# Patient Record
Sex: Female | Born: 1985 | Race: White | Hispanic: No | Marital: Married | Smoking: Never smoker
Health system: Southern US, Community
[De-identification: ages and names within clinical notes are randomized; demographics above are authoritative.]

## PROBLEM LIST (undated history)

## (undated) DIAGNOSIS — K259 Gastric ulcer, unspecified as acute or chronic, without hemorrhage or perforation: Secondary | ICD-10-CM

## (undated) DIAGNOSIS — F5000 Anorexia nervosa, unspecified: Secondary | ICD-10-CM

## (undated) HISTORY — DX: Gastric ulcer, unspecified as acute or chronic, without hemorrhage or perforation: K25.9

## (undated) HISTORY — DX: Anorexia nervosa, unspecified: F50.00

## (undated) HISTORY — PX: HERNIA REPAIR: SHX51

---

## 2015-11-21 ENCOUNTER — Ambulatory Visit (INDEPENDENT_AMBULATORY_CARE_PROVIDER_SITE_OTHER): Payer: Self-pay | Admitting: *Deleted

## 2015-11-21 DIAGNOSIS — Z3200 Encounter for pregnancy test, result unknown: Secondary | ICD-10-CM

## 2015-11-22 LAB — POCT PREGNANCY, URINE: PREG TEST UR: POSITIVE — AB

## 2015-12-09 NOTE — L&D Delivery Note (Signed)
Delivery Note Admitted in active labor, no augmentation, progressed well on her own w/ srom mod MSF- desired waterbirth- got in tub not long after srom. 2 Doublas present. A short time later she felt strong urge to push, and at 4:13 AM a viable female was delivered via vtx (Presentation: unknown- under water and couldn't see presentation), birthed completely by pt w/ my direct supervision and gently brought to surface of water by her. Directly skin-to-skin on mom's chest, dried/stimulated w/ good respirations/cry.  APGAR:see delivery summary documentation. Weight: unknown at time of note.  Mom allowed to bond w/ baby, then out of tub for placenta.  Placenta status:delivered w/ pt standing just outside of tub without difficulty. Pt refused clamping/cutting cord until further notice.  Cord: 3VC, with the following complications: none.  Cord pH: not done  Anesthesia:  none Episiotomy:  n/a Lacerations:  Small Lt periurethral, appears hemostatic, but pt would not let me exam it or perineum-which appeared to be intact- any further, stating 'she does not want stitches, and that was enough inspection' Suture Repair: n/a Est. Blood Loss (mL):   Mom to postpartum.  Baby to Couplet care / Skin to Skin. Breastfeeding, plans mirena, no circumcision  Marge Duncans 06/29/2016, 6:40 AM

## 2015-12-19 ENCOUNTER — Encounter: Payer: Self-pay | Admitting: Advanced Practice Midwife

## 2015-12-19 ENCOUNTER — Ambulatory Visit (INDEPENDENT_AMBULATORY_CARE_PROVIDER_SITE_OTHER): Payer: Managed Care, Other (non HMO) | Admitting: Advanced Practice Midwife

## 2015-12-19 VITALS — BP 117/68 | HR 92 | Temp 98.6°F | Ht 64.0 in | Wt 118.9 lb

## 2015-12-19 DIAGNOSIS — Z3491 Encounter for supervision of normal pregnancy, unspecified, first trimester: Secondary | ICD-10-CM | POA: Diagnosis not present

## 2015-12-19 DIAGNOSIS — Z77011 Contact with and (suspected) exposure to lead: Secondary | ICD-10-CM

## 2015-12-19 DIAGNOSIS — O3680X0 Pregnancy with inconclusive fetal viability, not applicable or unspecified: Secondary | ICD-10-CM

## 2015-12-19 DIAGNOSIS — Z36 Encounter for antenatal screening of mother: Secondary | ICD-10-CM | POA: Diagnosis not present

## 2015-12-19 DIAGNOSIS — Z3490 Encounter for supervision of normal pregnancy, unspecified, unspecified trimester: Secondary | ICD-10-CM | POA: Insufficient documentation

## 2015-12-19 DIAGNOSIS — Z3481 Encounter for supervision of other normal pregnancy, first trimester: Secondary | ICD-10-CM

## 2015-12-19 DIAGNOSIS — O3680X1 Pregnancy with inconclusive fetal viability, fetus 1: Secondary | ICD-10-CM

## 2015-12-19 DIAGNOSIS — Z113 Encounter for screening for infections with a predominantly sexual mode of transmission: Secondary | ICD-10-CM | POA: Diagnosis not present

## 2015-12-19 LAB — POCT URINALYSIS DIP (DEVICE)
Bilirubin Urine: NEGATIVE
Glucose, UA: NEGATIVE mg/dL
Hgb urine dipstick: NEGATIVE
Ketones, ur: NEGATIVE mg/dL
Leukocytes, UA: NEGATIVE
NITRITE: NEGATIVE
PH: 7 (ref 5.0–8.0)
PROTEIN: NEGATIVE mg/dL
Specific Gravity, Urine: 1.02 (ref 1.005–1.030)
Urobilinogen, UA: 0.2 mg/dL (ref 0.0–1.0)

## 2015-12-19 LAB — OB RESULTS CONSOLE GC/CHLAMYDIA
Chlamydia: NEGATIVE
GC PROBE AMP, GENITAL: NEGATIVE

## 2015-12-19 LAB — OB RESULTS CONSOLE RUBELLA ANTIBODY, IGM: RUBELLA: IMMUNE

## 2015-12-19 NOTE — Progress Notes (Signed)
   Subjective:    Jacqueline Andersen is a G2P1001 2618w3d by LMP and US being seen today for her first obstetrical visit.  Her obstetrical history is significant for nothing. Patient does intend to breast feed. Pregnancy history fully reviewed.  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 Requesting testing.  Filed Vitals:   12/19/15 0921 12/19/15 0944  BP: 117/68   Pulse: 92   Temp: 98.6 F (37 C)   Height:  5\' 4"  (1.626 m)  Weight: 118 lb 14.4 oz (53.933 kg)     HISTORY: OB History  Gravida Para Term Preterm AB SAB TAB Ectopic Multiple Living  2 1 1  0 0 0 0 0 0 1    # Outcome Date GA Lbr Len/2nd Weight Sex Delivery Anes PTL Lv  2 Current           1 Term 12/04/11 2820w0d  7 lb (3.175 kg) M Vag-Spont   Y     Past Medical History  Diagnosis Date  . Anorexia nervosa 7212-30 years old  . Gastric ulcer 30 years old   Past Surgical History  Procedure Laterality Date  . Hernia repair      as a baby   Family History  Problem Relation Age of Onset  . Cancer Maternal Grandmother   . Cancer Paternal Grandmother      Exam    Uterus:  Fundal Height: 12 cm  Pelvic Exam: Deferred. Will perform w/ Pap   Bony Pelvis: proven to 7 lb  System: Breast:  normal appearance, no masses or tenderness   Skin: normal coloration and turgor, no rashes    Neurologic: oriented, normal, grossly non-focal   Extremities: normal strength, tone, and muscle mass   HEENT sclera clear, anicteric and thyroid without masses   Mouth/Teeth mucous membranes moist, pharynx normal without lesions and dental hygiene good   Neck supple and no masses   Cardiovascular: regular rate and rhythm, no murmurs or gallops   Respiratory:  appears well, vitals normal, no respiratory distress, acyanotic, normal RR, chest clear, no wheezing, crepitations, rhonchi, normal symmetric air entry   Abdomen: soft, non-tender; bowel sounds normal; no masses,  no organomegaly   Urinary:  deferred      Assessment:    Pregnancy: G2P1001 Patient Active Problem List   Diagnosis Date Noted  . Supervision of normal pregnancy, antepartum 12/19/2015  1. Supervision of low-risk pregnancy, first trimester  - Prenatal Profile - GC/Chlamydia probe amp ([place] - Prescript Monitor Profile(19) - Culture, OB Urine - Cystic fibrosis diagnostic study - Amniotic fluid index  2. Supervision of normal pregnancy, antepartum, first trimester   3. Lead exposure   4. Examination to determine fetal viability of pregnancy, fetus 1  - Amniotic fluid index  5. Hx anorexia  - Denies current problem - Encouraged counseling PRN      Plan:     Initial labs drawn. Prenatal vitamins. Problem list reviewed and updated. Genetic Screening discussed First Screen: declined. CF drawn  Ultrasound discussed; fetal survey: requested.  Follow up in 4 weeks. Pt wants to check insurance coverage for Pap first. Ask about at [place].   Dorathy KinsmanSMITH, Jamarr Treinen 12/19/2015

## 2015-12-19 NOTE — Progress Notes (Signed)
Breastfeeding tip of the week reviewed Initial prenatal education packet given Initial prenatal labs today Declines flu

## 2015-12-19 NOTE — Patient Instructions (Signed)
Second Trimester of Pregnancy The second trimester is from week 13 through week 28, months 4 through 6. The second trimester is often a time when you feel your best. Your body has also adjusted to being pregnant, and you begin to feel better physically. Usually, morning sickness has lessened or quit completely, you may have more energy, and you may have an increase in appetite. The second trimester is also a time when the fetus is growing rapidly. At the end of the sixth month, the fetus is about 9 inches long and weighs about 1 pounds. You will likely begin to feel the baby move (quickening) between 18 and 20 weeks of the pregnancy. BODY CHANGES Your body goes through many changes during pregnancy. The changes vary from woman to woman.   Your weight will continue to increase. You will notice your lower abdomen bulging out.  You may begin to get stretch marks on your hips, abdomen, and breasts.  You may develop headaches that can be relieved by medicines approved by your health care provider.  You may urinate more often because the fetus is pressing on your bladder.  You may develop or continue to have heartburn as a result of your pregnancy.  You may develop constipation because certain hormones are causing the muscles that push waste through your intestines to slow down.  You may develop hemorrhoids or swollen, bulging veins (varicose veins).  You may have back pain because of the weight gain and pregnancy hormones relaxing your joints between the bones in your pelvis and as a result of a shift in weight and the muscles that support your balance.  Your breasts will continue to grow and be tender.  Your gums may bleed and may be sensitive to brushing and flossing.  Dark spots or blotches (chloasma, mask of pregnancy) may develop on your face. This will likely fade after the baby is born.  A dark line from your belly button to the pubic area (linea nigra) may appear. This will likely  fade after the baby is born.  You may have changes in your hair. These can include thickening of your hair, rapid growth, and changes in texture. Some women also have hair loss during or after pregnancy, or hair that feels dry or thin. Your hair will most likely return to normal after your baby is born. WHAT TO EXPECT AT YOUR PRENATAL VISITS During a routine prenatal visit:  You will be weighed to make sure you and the fetus are growing normally.  Your blood pressure will be taken.  Your abdomen will be measured to track your baby's growth.  The fetal heartbeat will be listened to.  Any test results from the previous visit will be discussed. Your health care provider may ask you:  How you are feeling.  If you are feeling the baby move.  If you have had any abnormal symptoms, such as leaking fluid, bleeding, severe headaches, or abdominal cramping.  If you are using any tobacco products, including cigarettes, chewing tobacco, and electronic cigarettes.  If you have any questions. Other tests that may be performed during your second trimester include:  Blood tests that check for:  Low iron levels (anemia).  Gestational diabetes (between 24 and 28 weeks).  Rh antibodies.  Urine tests to check for infections, diabetes, or protein in the urine.  An ultrasound to confirm the proper growth and development of the baby.  An amniocentesis to check for possible genetic problems.  Fetal screens for spina bifida   and Down syndrome.  HIV (human immunodeficiency virus) testing. Routine prenatal testing includes screening for HIV, unless you choose not to have this test. HOME CARE INSTRUCTIONS   Avoid all smoking, herbs, alcohol, and unprescribed drugs. These chemicals affect the formation and growth of the baby.  Do not use any tobacco products, including cigarettes, chewing tobacco, and electronic cigarettes. If you need help quitting, ask your health care provider. You may receive  counseling support and other resources to help you quit.  Follow your health care provider's instructions regarding medicine use. There are medicines that are either safe or unsafe to take during pregnancy.  Exercise only as directed by your health care provider. Experiencing uterine cramps is a good sign to stop exercising.  Continue to eat regular, healthy meals.  Wear a good support bra for breast tenderness.  Do not use hot tubs, steam rooms, or saunas.  Wear your seat belt at all times when driving.  Avoid raw meat, uncooked cheese, cat litter boxes, and soil used by cats. These carry germs that can cause birth defects in the baby.  Take your prenatal vitamins.  Take 1500-2000 mg of calcium daily starting at the 20th week of pregnancy until you deliver your baby.  Try taking a stool softener (if your health care provider approves) if you develop constipation. Eat more high-fiber foods, such as fresh vegetables or fruit and whole grains. Drink plenty of fluids to keep your urine clear or pale yellow.  Take warm sitz baths to soothe any pain or discomfort caused by hemorrhoids. Use hemorrhoid cream if your health care provider approves.  If you develop varicose veins, wear support hose. Elevate your feet for 15 minutes, 3-4 times a day. Limit salt in your diet.  Avoid heavy lifting, wear low heel shoes, and practice good posture.  Rest with your legs elevated if you have leg cramps or low back pain.  Visit your dentist if you have not gone yet during your pregnancy. Use a soft toothbrush to brush your teeth and be gentle when you floss.  A sexual relationship may be continued unless your health care provider directs you otherwise.  Continue to go to all your prenatal visits as directed by your health care provider. SEEK MEDICAL CARE IF:   You have dizziness.  You have mild pelvic cramps, pelvic pressure, or nagging pain in the abdominal area.  You have persistent nausea,  vomiting, or diarrhea.  You have a bad smelling vaginal discharge.  You have pain with urination. SEEK IMMEDIATE MEDICAL CARE IF:   You have a fever.  You are leaking fluid from your vagina.  You have spotting or bleeding from your vagina.  You have severe abdominal cramping or pain.  You have rapid weight gain or loss.  You have shortness of breath with chest pain.  You notice sudden or extreme swelling of your face, hands, ankles, feet, or legs.  You have not felt your baby move in over an hour.  You have severe headaches that do not go away with medicine.  You have vision changes.   This information is not intended to replace advice given to you by your health care provider. Make sure you discuss any questions you have with your health care provider.   Document Released: 11/18/2001 Document Revised: 12/15/2014 Document Reviewed: 01/25/2013 Elsevier Interactive Patient Education 2016 Elsevier Inc.   Pregnancy and Influenza Influenza, also called the flu, is an infection of the respiratory tract. If you are pregnant, you   are more likely to catch the flu. You are also more likely to have a more serious case of the flu. This is because pregnancy lowers your body's ability to fight off infections (it weakens your immune system). It also puts additional stress on your heart and lungs, which makes you more likely to have complications. Having a bad case of the flu, especially with a high fever, can be dangerous for your developing baby. It can cause you to go into early labor. HOW DO PEOPLE GET THE FLU? The flu is caused by the influenza virus. This virus is common every year in the fall and winter. It spreads when virus particles get passed from person to person. You can get the virus if you are near a sick person who is coughing or sneezing. You can also get the virus if you touch something that has the virus on it and then touch your face. HOW CAN I PROTECT MYSELF AGAINST THE  FLU?  Get a flu shot. The best way to prevent the flu is to get a flu shot before flu season starts. The flu shot is not dangerous for your developing baby. It may even help protect your baby from the flu for up to 6 months after birth. The flu shot is one type of flu vaccine. Another type is a nasal spray vaccine. Do not get the nasal spray vaccine. It is not approved for pregnancy.  Do not come in close contact with sick people.  Do not share food, drinks, or utensils with other people.  Wash your hands often. Use hand sanitizer when soap and water are not available. WHAT SHOULD I DO IF I HAVE FLU SYMPTOMS? If you have any flu symptoms, call your health care provider right away. Flu symptoms include:  Fever or chills.  Muscle aches.  Headache.  Sore throat.  Nasal congestion.  Cough.  Feeling tired.  Loss of appetite.  Vomiting.  Diarrhea. You may be able to take an antiviral medicine to keep the flu from becoming severe and to shorten how long it lasts. WHAT SHOULD I DO AT HOME IF I AM DIAGNOSED WITH THE FLU?  Do not take any medicine, including cold or flu medicine, unless directed by your health care provider.  If you take antiviral medicine, make sure you finish it even if you start to feel better.  Drink enough fluid to keep your urine clear or pale yellow.  Get plenty of rest. WHEN WOULD I SEEK IMMEDIATE MEDICAL CARE IF I HAVE THE FLU?  You have trouble breathing.  You have chest pain.  You begin to have labor pains.  You have a high fever that does not go down after you take medicine.  You do not feel your baby move.  You have diarrhea or vomiting that will not go away.   This information is not intended to replace advice given to you by your health care provider. Make sure you discuss any questions you have with your health care provider.   Document Released: 09/26/2008 Document Revised: 11/29/2013 Document Reviewed: 10/21/2013 Elsevier Interactive  Patient Education 2016 Elsevier Inc.   

## 2015-12-20 LAB — GC/CHLAMYDIA PROBE AMP (~~LOC~~) NOT AT ARMC
Chlamydia: NEGATIVE
Neisseria Gonorrhea: NEGATIVE

## 2015-12-20 NOTE — Progress Notes (Signed)
Bedside US for viability and estimate of EGA = Single IUP, FHR - 156 bpm per PW doppler, FL - 0.812cm (12w 3d), CRL - 5.41cm (12w), FM present

## 2015-12-26 DIAGNOSIS — Z77011 Contact with and (suspected) exposure to lead: Secondary | ICD-10-CM | POA: Insufficient documentation

## 2016-01-16 ENCOUNTER — Ambulatory Visit (INDEPENDENT_AMBULATORY_CARE_PROVIDER_SITE_OTHER): Payer: Managed Care, Other (non HMO) | Admitting: Certified Nurse Midwife

## 2016-01-16 VITALS — BP 106/58 | HR 93 | Temp 98.6°F | Wt 124.7 lb

## 2016-01-16 DIAGNOSIS — Z3482 Encounter for supervision of other normal pregnancy, second trimester: Secondary | ICD-10-CM

## 2016-01-16 DIAGNOSIS — Z124 Encounter for screening for malignant neoplasm of cervix: Secondary | ICD-10-CM | POA: Diagnosis not present

## 2016-01-16 LAB — POCT URINALYSIS DIP (DEVICE)
Bilirubin Urine: NEGATIVE
Glucose, UA: NEGATIVE mg/dL
Hgb urine dipstick: NEGATIVE
Ketones, ur: NEGATIVE mg/dL
Nitrite: NEGATIVE
Protein, ur: NEGATIVE mg/dL
Specific Gravity, Urine: 1.02 (ref 1.005–1.030)
Urobilinogen, UA: 0.2 mg/dL (ref 0.0–1.0)
pH: 6.5 (ref 5.0–8.0)

## 2016-01-16 NOTE — Progress Notes (Signed)
Subjective:  Jacqueline Andersen is a 30 y.o. G2P1001 at [redacted]w[redacted]d being seen today for ongoing prenatal care.  She is currently monitored for the following issues for this low-risk pregnancy and has Supervision of normal pregnancy, antepartum and Lead exposure on her problem list.  Patient reports backache, fatigue and round ligament pain.  Contractions: Not present. Vag. Bleeding: None.  Movement: Present. Denies leaking of fluid.   The following portions of the patient's history were reviewed and updated as appropriate: allergies, current medications, past family history, past medical history, past social history, past surgical history and problem list. Problem list updated.  Objective:   Filed Vitals:   01/16/16 1038  BP: 106/58  Pulse: 93  Temp: 98.6 F (37 C)  Weight: 124 lb 11.2 oz (56.564 kg)    Fetal Status: Fetal Heart Rate (bpm): 147 Fundal Height: 13 cm Movement: Present     General:  Alert, oriented and cooperative. Patient is in no acute distress.  Skin: Skin is warm and dry. No rash noted.   Cardiovascular: Normal heart rate noted  Respiratory: Normal respiratory effort, no problems with respiration noted  Abdomen: Soft, gravid, appropriate for gestational age. Pain/Pressure: Present     Pelvic: Vag. Bleeding: None     Cervical exam deferred       Pap done no bleeding after PAP  Extremities: Normal range of motion.  Edema: None  Mental Status: Normal mood and affect. Normal behavior. Normal judgment and thought content.   Urinalysis: Urine Protein: Negative Urine Glucose: Negative  Assessment and Plan:  Pregnancy: G2P1001 at [redacted]w[redacted]d  1. Supervision of normal pregnancy, antepartum, second trimester - Cytology - PAP Anatomy u/s scheduled Quad declined  Preterm labor symptoms and general obstetric precautions including but not limited to vaginal bleeding, contractions, leaking of fluid and fetal movement were reviewed in detail with the patient. Please refer to After Visit  Summary for other counseling recommendations.  No Follow-up on file. 4 weeks  Rhea Pink, CNM

## 2016-01-16 NOTE — Patient Instructions (Signed)
Vaginal Bleeding During Pregnancy, Second Trimester A small amount of bleeding (spotting) from the vagina is relatively common in pregnancy. It usually stops on its own. Various things can cause bleeding or spotting in pregnancy. Some bleeding may be related to the pregnancy, and some may not. Sometimes the bleeding is normal and is not a problem. However, bleeding can also be a sign of something serious. Be sure to tell your health care provider about any vaginal bleeding right away. Some possible causes of vaginal bleeding during the second trimester include:  Infection, inflammation, or growths on the cervix.   The placenta may be partially or completely covering the opening of the cervix inside the uterus (placenta previa).  The placenta may have separated from the uterus (abruption of the placenta).   You may be having early (preterm) labor.   The cervix may not be strong enough to keep a baby inside the uterus (cervical insufficiency).   Tiny cysts may have developed in the uterus instead of pregnancy tissue (molar pregnancy). HOME CARE INSTRUCTIONS  Watch your condition for any changes. The following actions may help to lessen any discomfort you are feeling:  Follow your health care provider's instructions for limiting your activity. If your health care provider orders bed rest, you may need to stay in bed and only get up to use the bathroom. However, your health care provider may allow you to continue light activity.  If needed, make plans for someone to help with your regular activities and responsibilities while you are on bed rest.  Keep track of the number of pads you use each day, how often you change pads, and how soaked (saturated) they are. Write this down.  Do not use tampons. Do not douche.  Do not have sexual intercourse or orgasms until approved by your health care provider.  If you pass any tissue from your vagina, save the tissue so you can show it to your  health care provider.  Only take over-the-counter or prescription medicines as directed by your health care provider.  Do not take aspirin because it can make you bleed.  Do not exercise or perform any strenuous activities or heavy lifting without your health care provider's permission.  Keep all follow-up appointments as directed by your health care provider. SEEK MEDICAL CARE IF:  You have any vaginal bleeding during any part of your pregnancy.  You have cramps or labor pains.  You have a fever, not controlled by medicine. SEEK IMMEDIATE MEDICAL CARE IF:   You have severe cramps in your back or belly (abdomen).  You have contractions.  You have chills.  You pass large clots or tissue from your vagina.  Your bleeding increases.  You feel light-headed or weak, or you have fainting episodes.  You are leaking fluid or have a gush of fluid from your vagina. MAKE SURE YOU:  Understand these instructions.  Will watch your condition.  Will get help right away if you are not doing well or get worse.   This information is not intended to replace advice given to you by your health care provider. Make sure you discuss any questions you have with your health care provider.   Document Released: 09/03/2005 Document Revised: 11/29/2013 Document Reviewed: 08/01/2013 Elsevier Interactive Patient Education 2016 Elsevier Inc.  

## 2016-01-16 NOTE — Progress Notes (Signed)
Breastfeeding tip of the week reviewed. 

## 2016-01-17 LAB — CYTOLOGY - PAP

## 2016-02-04 ENCOUNTER — Other Ambulatory Visit: Payer: Self-pay | Admitting: Certified Nurse Midwife

## 2016-02-04 ENCOUNTER — Ambulatory Visit (HOSPITAL_COMMUNITY)
Admission: RE | Admit: 2016-02-04 | Discharge: 2016-02-04 | Disposition: A | Discharge status: Home / Self Care | Payer: Managed Care, Other (non HMO) | Source: Ambulatory Visit | Attending: Certified Nurse Midwife | Admitting: Certified Nurse Midwife

## 2016-02-04 DIAGNOSIS — Z3482 Encounter for supervision of other normal pregnancy, second trimester: Secondary | ICD-10-CM

## 2016-02-04 DIAGNOSIS — Z3A19 19 weeks gestation of pregnancy: Secondary | ICD-10-CM | POA: Diagnosis not present

## 2016-02-04 DIAGNOSIS — Z36 Encounter for antenatal screening of mother: Secondary | ICD-10-CM | POA: Diagnosis not present

## 2016-02-04 DIAGNOSIS — Z3689 Encounter for other specified antenatal screening: Secondary | ICD-10-CM

## 2016-02-13 ENCOUNTER — Ambulatory Visit (INDEPENDENT_AMBULATORY_CARE_PROVIDER_SITE_OTHER): Payer: Managed Care, Other (non HMO) | Admitting: Certified Nurse Midwife

## 2016-02-13 VITALS — BP 107/62 | HR 90 | Temp 98.3°F | Wt 126.6 lb

## 2016-02-13 DIAGNOSIS — Z3482 Encounter for supervision of other normal pregnancy, second trimester: Secondary | ICD-10-CM

## 2016-02-13 LAB — POCT URINALYSIS DIP (DEVICE)
Bilirubin Urine: NEGATIVE
Glucose, UA: NEGATIVE mg/dL
Hgb urine dipstick: NEGATIVE
Ketones, ur: NEGATIVE mg/dL
Nitrite: NEGATIVE
Protein, ur: NEGATIVE mg/dL
Specific Gravity, Urine: 1.02 (ref 1.005–1.030)
Urobilinogen, UA: 1 mg/dL (ref 0.0–1.0)
pH: 7 (ref 5.0–8.0)

## 2016-02-13 NOTE — Progress Notes (Signed)
Pt reports pain in lower abdomen, pt reports it is constant and cant get comfortable at night. Cant sit or stand to long.

## 2016-02-13 NOTE — Patient Instructions (Signed)

## 2016-02-13 NOTE — Progress Notes (Signed)
Subjective:  Carilyn GoodpastureDane Kaucher is a 30 y.o. G2P1001 at 4835w3d being seen today for ongoing prenatal care.  She is currently monitored for the following issues for this low-risk pregnancy and has Supervision of normal pregnancy, antepartum and Lead exposure on her problem list.  Patient reports round ligament pain.   .  .  Movement: Present. Denies leaking of fluid.   The following portions of the patient's history were reviewed and updated as appropriate: allergies, current medications, past family history, past medical history, past social history, past surgical history and problem list. Problem list updated.  Objective:   Filed Vitals:   02/13/16 1113  BP: 107/62  Pulse: 90  Temp: 98.3 F (36.8 C)  Weight: 126 lb 9.6 oz (57.425 kg)    Fetal Status: Fetal Heart Rate (bpm): 145   Movement: Present     General:  Alert, oriented and cooperative. Patient is in no acute distress.  Skin: Skin is warm and dry. No rash noted.   Cardiovascular: Normal heart rate noted  Respiratory: Normal respiratory effort, no problems with respiration noted  Abdomen: Soft, gravid, appropriate for gestational age. Pain/Pressure: Present     Pelvic:       Cervical exam deferred        Extremities: Normal range of motion.  Edema: None  Mental Status: Normal mood and affect. Normal behavior. Normal judgment and thought content.   Urinalysis:      Assessment and Plan:  Pregnancy: G2P1001 at 10835w3d  1. Supervision of normal pregnancy, antepartum, second trimester  - AFP, Quad Screen  Preterm labor symptoms and general obstetric precautions including but not limited to vaginal bleeding, contractions, leaking of fluid and fetal movement were reviewed in detail with the patient. Please refer to After Visit Summary for other counseling recommendations.  Return in about 4 weeks (around 03/12/2016).   Rhea PinkLori A Juletta Berhe, CNM

## 2016-02-15 ENCOUNTER — Telehealth: Payer: Self-pay | Admitting: *Deleted

## 2016-02-15 NOTE — Telephone Encounter (Signed)
Patient called stating that she is pregnant and concerned that she experiences vaginal bleeding after sexual intercourse. Wants to know if this is normal.

## 2016-02-15 NOTE — Telephone Encounter (Signed)
Attempted to call, no answer, got message that voice mail was not set up yet.

## 2016-02-19 LAB — AFP, QUAD SCREEN
AFP: 95.7 ng/mL
Age Alone: 1:700 {titer}
Curr Gest Age: 20.3 wks.days
Down Syndrome Scr Risk Est: <1:38500 {titer}
HCG, Total: 21.71 IU/mL
INH: 177.8 pg/mL
Interpretation-AFP: NEGATIVE
MoM for AFP: 1.43
MoM for INH: 0.79
MoM for hCG: 0.92
Open Spina bifida: NEGATIVE
Osb Risk: 1:3410 {titer}
Tri 18 Scr Risk Est: NEGATIVE
Trisomy 18 (Edward) Syndrome Interp.: 1:55900 {titer}
uE3 Mom: 0.96
uE3 Value: 1.88 ng/mL

## 2016-02-20 NOTE — Telephone Encounter (Signed)
Attempted to contact pt and was unable to LM due to VM not set up.

## 2016-02-22 ENCOUNTER — Telehealth: Payer: Self-pay | Admitting: *Deleted

## 2016-02-22 NOTE — Telephone Encounter (Signed)
Spoke with Dr. Debroah LoopArnold about patient and he recommends that she take benadryl and use hydrocortisone. Called patient back and small child answered the phone, asked for patient and call was disconnected. Tried again and child answered again. Will try again later.

## 2016-02-22 NOTE — Telephone Encounter (Signed)
Pt states that she got poison ivy a few days ago and it has spread all over her whole body. She is using home remedies like ACV and Calamine. She is feeling miserable and running a fever now. Is there anything safe that she can use or take to help with it?

## 2016-02-22 NOTE — Telephone Encounter (Signed)
Informed patient of Dr. Brayton LaymanArnolds plan. Patient had no further questions.

## 2016-03-12 ENCOUNTER — Encounter: Payer: Managed Care, Other (non HMO) | Admitting: Advanced Practice Midwife

## 2016-03-20 ENCOUNTER — Ambulatory Visit (INDEPENDENT_AMBULATORY_CARE_PROVIDER_SITE_OTHER): Payer: Managed Care, Other (non HMO) | Admitting: Family

## 2016-03-20 VITALS — BP 113/70 | HR 89 | Temp 98.2°F | Wt 131.2 lb

## 2016-03-20 DIAGNOSIS — Z77011 Contact with and (suspected) exposure to lead: Secondary | ICD-10-CM

## 2016-03-20 DIAGNOSIS — Z3482 Encounter for supervision of other normal pregnancy, second trimester: Secondary | ICD-10-CM

## 2016-03-20 LAB — POCT URINALYSIS DIP (DEVICE)
Bilirubin Urine: NEGATIVE
Glucose, UA: NEGATIVE mg/dL
Hgb urine dipstick: NEGATIVE
KETONES UR: NEGATIVE mg/dL
Nitrite: NEGATIVE
PH: 7 (ref 5.0–8.0)
PROTEIN: NEGATIVE mg/dL
SPECIFIC GRAVITY, URINE: 1.015 (ref 1.005–1.030)
Urobilinogen, UA: 0.2 mg/dL (ref 0.0–1.0)

## 2016-03-20 NOTE — Progress Notes (Signed)
Subjective:  Jacqueline GoodpastureDane Dzik is a 30 y.o. G2P1001 at 7661w4d being seen today for ongoing prenatal care.  She is currently monitored for the following issues for this low-risk pregnancy and has Supervision of normal pregnancy, antepartum and Lead exposure on her problem list.  Patient reports no complaints.  Contractions: Not present. Vag. Bleeding: None.  Movement: Present. Denies leaking of fluid.   The following portions of the patient's history were reviewed and updated as appropriate: allergies, current medications, past family history, past medical history, past social history, past surgical history and problem list. Problem list updated.  Objective:   Filed Vitals:   03/20/16 1019  BP: 113/70  Pulse: 89  Temp: 98.2 F (36.8 C)  Weight: 131 lb 3.2 oz (59.512 kg)    Fetal Status: Fetal Heart Rate (bpm): 140 Fundal Height: 26 cm Movement: Present     General:  Alert, oriented and cooperative. Patient is in no acute distress.  Skin: Skin is warm and dry. No rash noted.   Cardiovascular: Normal heart rate noted  Respiratory: Normal respiratory effort, no problems with respiration noted  Abdomen: Soft, gravid, appropriate for gestational age. Pain/Pressure: Present     Pelvic: Vag. Bleeding: None     Cervical exam deferred        Extremities: Normal range of motion.  Edema: None  Mental Status: Normal mood and affect. Normal behavior. Normal judgment and thought content.   Urinalysis: Urine Protein: Negative Urine Glucose: Negative  Assessment and Plan:  Pregnancy: G2P1001 at 4561w4d  1. Supervision of normal pregnancy, antepartum, second trimester - discussed third trimester labs  2. Lead exposure - Obtain lead level with third trimester labs  Preterm labor symptoms and general obstetric precautions including but not limited to vaginal bleeding, contractions, leaking of fluid and fetal movement were reviewed in detail with the patient. Please refer to After Visit Summary for other  counseling recommendations.  Return in about 3 weeks (around 04/10/2016).   Eino FarberWalidah Kennith GainN Karim, CNM

## 2016-03-20 NOTE — Progress Notes (Signed)
Educated pt on Benefits of Breastfeeding for Baby 

## 2016-04-09 ENCOUNTER — Ambulatory Visit (INDEPENDENT_AMBULATORY_CARE_PROVIDER_SITE_OTHER): Payer: Managed Care, Other (non HMO) | Admitting: Advanced Practice Midwife

## 2016-04-09 VITALS — BP 106/58 | HR 93 | Wt 133.4 lb

## 2016-04-09 DIAGNOSIS — Z23 Encounter for immunization: Secondary | ICD-10-CM | POA: Diagnosis not present

## 2016-04-09 DIAGNOSIS — Z77011 Contact with and (suspected) exposure to lead: Secondary | ICD-10-CM

## 2016-04-09 DIAGNOSIS — O99613 Diseases of the digestive system complicating pregnancy, third trimester: Secondary | ICD-10-CM

## 2016-04-09 DIAGNOSIS — K219 Gastro-esophageal reflux disease without esophagitis: Secondary | ICD-10-CM

## 2016-04-09 DIAGNOSIS — Z3483 Encounter for supervision of other normal pregnancy, third trimester: Secondary | ICD-10-CM

## 2016-04-09 LAB — CBC
HEMATOCRIT: 31.8 % — AB (ref 35.0–45.0)
HEMOGLOBIN: 10.6 g/dL — AB (ref 11.7–15.5)
MCH: 28.9 pg (ref 27.0–33.0)
MCHC: 33.3 g/dL (ref 32.0–36.0)
MCV: 86.6 fL (ref 80.0–100.0)
MPV: 9.9 fL (ref 7.5–12.5)
Platelets: 189 10*3/uL (ref 140–400)
RBC: 3.67 MIL/uL — AB (ref 3.80–5.10)
RDW: 14.1 % (ref 11.0–15.0)
WBC: 4.9 10*3/uL (ref 3.8–10.8)

## 2016-04-09 LAB — POCT URINALYSIS DIP (DEVICE)
BILIRUBIN URINE: NEGATIVE
GLUCOSE, UA: NEGATIVE mg/dL
Hgb urine dipstick: NEGATIVE
KETONES UR: NEGATIVE mg/dL
LEUKOCYTES UA: NEGATIVE
Nitrite: NEGATIVE
Protein, ur: NEGATIVE mg/dL
Urobilinogen, UA: 0.2 mg/dL (ref 0.0–1.0)
pH: 5.5 (ref 5.0–8.0)

## 2016-04-09 MED ORDER — FAMOTIDINE-CA CARB-MAG HYDROX 10-800-165 MG PO CHEW: 2.0000 | CHEWABLE_TABLET | Freq: Two times a day (BID) | ORAL | Status: DC | PRN

## 2016-04-09 MED ORDER — TETANUS-DIPHTH-ACELL PERTUSSIS 5-2.5-18.5 LF-MCG/0.5 IM SUSP
0.5000 mL | Freq: Once | INTRAMUSCULAR | Status: AC
  Administered 2016-04-09: 0.5 mL via INTRAMUSCULAR

## 2016-04-09 NOTE — Progress Notes (Signed)
Increased restless legs at night time

## 2016-04-09 NOTE — Patient Instructions (Addendum)
Food Choices for Gastroesophageal Reflux Disease, Adult When you have gastroesophageal reflux disease (GERD), the foods you eat and your eating habits are very important. Choosing the right foods can help ease the discomfort of GERD. WHAT GENERAL GUIDELINES DO I NEED TO FOLLOW?  Choose fruits, vegetables, whole grains, low-fat dairy products, and low-fat meat, fish, and poultry.  Limit fats such as oils, salad dressings, butter, nuts, and avocado.  Keep a food diary to identify foods that cause symptoms.  Avoid foods that cause reflux. These may be different for different people.  Eat frequent small meals instead of three large meals each day.  Eat your meals slowly, in a relaxed setting.  Limit fried foods.  Cook foods using methods other than frying.  Avoid drinking alcohol.  Avoid drinking large amounts of liquids with your meals.  Avoid bending over or lying down until 2-3 hours after eating. WHAT FOODS ARE NOT RECOMMENDED? The following are some foods and drinks that may worsen your symptoms: Vegetables Tomatoes. Tomato juice. Tomato and spaghetti sauce. Chili peppers. Onion and garlic. Horseradish. Fruits Oranges, grapefruit, and lemon (fruit and juice). Meats High-fat meats, fish, and poultry. This includes hot dogs, ribs, ham, sausage, salami, and bacon. Dairy Whole milk and chocolate milk. Sour cream. Cream. Butter. Ice cream. Cream cheese.  Beverages Coffee and tea, with or without caffeine. Carbonated beverages or energy drinks. Condiments Hot sauce. Barbecue sauce.  Sweets/Desserts Chocolate and cocoa. Donuts. Peppermint and spearmint. Fats and Oils High-fat foods, including French fries and potato chips. Other Vinegar. Strong spices, such as black pepper, white pepper, red pepper, cayenne, curry powder, cloves, ginger, and chili powder. The items listed above may not be a complete list of foods and beverages to avoid. Contact your dietitian for more  information.   This information is not intended to replace advice given to you by your health care provider. Make sure you discuss any questions you have with your health care provider.   Document Released: 11/24/2005 Document Revised: 12/15/2014 Document Reviewed: 09/28/2013 Elsevier Interactive Patient Education 2016 Elsevier Inc.  

## 2016-04-09 NOTE — Addendum Note (Signed)
Addended by: Faythe CasaBELLAMY, Anzlee Hinesley M on: 04/09/2016 12:16 PM   Modules accepted: Orders

## 2016-04-09 NOTE — Progress Notes (Signed)
Subjective:  Jacqueline Andersen is a 30 y.o. G2P1001 at 769w3d being seen today for ongoing prenatal care.  She is currently monitored for the following issues for this low-risk pregnancy and has Supervision of normal pregnancy, antepartum and Lead exposure on her problem list.  Patient reports heartburn and restless legs .  Contractions: Not present. Vag. Bleeding: None.  Movement: Present. Denies leaking of fluid.   The following portions of the patient's history were reviewed and updated as appropriate: allergies, current medications, past family history, past medical history, past social history, past surgical history and problem list. Problem list updated.  Objective:   Filed Vitals:   04/09/16 0935  BP: 106/58  Pulse: 93  Weight: 133 lb 6.4 oz (60.51 kg)    Fetal Status: Fetal Heart Rate (bpm): 137   Movement: Present     General:  Alert, oriented and cooperative. Patient is in no acute distress.  Skin: Skin is warm and dry. No rash noted.   Cardiovascular: Normal heart rate noted  Respiratory: Normal respiratory effort, no problems with respiration noted  Abdomen: Soft, gravid, appropriate for gestational age. Pain/Pressure: Present     Pelvic: Vag. Bleeding: None Vag D/C Character: White   Cervical exam deferred        Extremities: Normal range of motion.  Edema: Trace  Mental Status: Normal mood and affect. Normal behavior. Normal judgment and thought content.   Urinalysis:    N/N  Assessment and Plan:  Pregnancy: G2P1001 at 469w3d  1. Lead exposure  - Lead, blood (adult age 30 yrs or greater)  2. Supervision of normal pregnancy, antepartum, third trimester  - Glucose Tolerance, 1 HR (50g) w/o Fasting - CBC - RPR - HIV antibody (with reflex)  3. Reflux  - Pepcid  Preterm labor symptoms and general obstetric precautions including but not limited to vaginal bleeding, contractions, leaking of fluid and fetal movement were reviewed in detail with the patient. Please refer  to After Visit Summary for other counseling recommendations.  Return in about 2 weeks (around 04/23/2016).   Jacqueline Andersen, CNM

## 2016-04-10 LAB — GLUCOSE TOLERANCE, 1 HOUR (50G) W/O FASTING: Glucose, 1 Hr, gestational: 107 mg/dL (ref <140)

## 2016-04-10 LAB — RPR

## 2016-04-10 LAB — HIV ANTIBODY (ROUTINE TESTING W REFLEX): HIV: NONREACTIVE

## 2016-04-11 LAB — LEAD, BLOOD (ADULT >= 16 YRS): LEAD-WHOLE BLOOD: 1 ug/dL (ref <5)

## 2016-04-15 ENCOUNTER — Telehealth: Payer: Self-pay

## 2016-04-15 NOTE — Telephone Encounter (Signed)
Pt has been informed of normal lead levels test results.

## 2016-04-30 ENCOUNTER — Ambulatory Visit (INDEPENDENT_AMBULATORY_CARE_PROVIDER_SITE_OTHER): Payer: Medicaid Other | Admitting: Family

## 2016-04-30 VITALS — BP 99/60 | HR 83 | Wt 138.9 lb

## 2016-04-30 DIAGNOSIS — Z3483 Encounter for supervision of other normal pregnancy, third trimester: Secondary | ICD-10-CM

## 2016-04-30 DIAGNOSIS — Z77011 Contact with and (suspected) exposure to lead: Secondary | ICD-10-CM | POA: Diagnosis not present

## 2016-04-30 LAB — POCT URINALYSIS DIP (DEVICE)
Bilirubin Urine: NEGATIVE
Glucose, UA: NEGATIVE mg/dL
HGB URINE DIPSTICK: NEGATIVE
Ketones, ur: NEGATIVE mg/dL
NITRITE: NEGATIVE
PH: 7 (ref 5.0–8.0)
Protein, ur: NEGATIVE mg/dL
Specific Gravity, Urine: 1.01 (ref 1.005–1.030)
UROBILINOGEN UA: 0.2 mg/dL (ref 0.0–1.0)

## 2016-04-30 NOTE — Patient Instructions (Signed)
Thinking About Doren Custard???  You must attend a Doren Custard class at [facility]  3rd Wednesday of every month from 7-9pm  Free  [employer] by calling [phone] or online at [email or web address].[email or web address]  Bring [place] the certificate from the class  Waterbirth supplies needed for [facility] Department patients:  Our practice has a Heritage manager in a Box tub at the hospital that you can borrow  You will need to purchase an accessory kit that has all needed supplies through [facility] [phone]) or online $175.00  Or you can purchase the supplies separately: o Single-use disposable tub liner for Birth Pool in a Box (REGULAR size) o New garden hose labeled "lead-free", "suitable for drinking water", o Electric drain pump to remove water (We recommend 792 gallon per hour or greater pump.)  o  "non-toxic" OR "water potable" o Garden hose to remove the dirty water o Fish net o Bathing suit top (optional) o Long-handled mirror (optional)  [employer] sells tubs for ~ $120 if you would rather purchase your own tub.  They also sell accessories, liners.    Www.waterbirthsolutions.com for tub purchases and supplies  The [employer] Ladies (www.thelaborladies.com) $275 for tub rental/set-up & take down/kit   [employer] information regarding doulas (labor support) who provide pool rentals:  IdentityList.se.htm   [employer] Labor Ladies (www.thelaborladies.com)  IdentityList.se.htm   Things that would prevent you from having a waterbirth:  Premature, <37wks  Previous cesarean birth  Presence of thick meconium-stained fluid  Multiple gestation (Twins, triplets, etc.)  Uncontrolled diabetes or gestational diabetes requiring medication  Hypertension  Heavy vaginal bleeding  Non-reassuring fetal heart rate  Active infection (MRSA, etc.)  If your labor has to be induced and induction  method requires continuous monitoring of the baby's heart rate  Other risks/issues identified by your obstetrical provider

## 2016-04-30 NOTE — Progress Notes (Signed)
Subjective:  Jacqueline Andersen is a 30 y.o. G2P1001 at 889w3d being seen today for ongoing prenatal care.  She is currently monitored for the following issues for this low-risk pregnancy and has Supervision of normal pregnancy, antepartum and Lead exposure on her problem list.  Patient reports no complaints.  Contractions: Not present. Vag. Bleeding: None.  Movement: Present. Denies leaking of fluid.   The following portions of the patient's history were reviewed and updated as appropriate: allergies, current medications, past family history, past medical history, past social history, past surgical history and problem list. Problem list updated.  Objective:   Filed Vitals:   04/30/16 1049  BP: 99/60  Pulse: 83  Weight: 138 lb 14.4 oz (63.005 kg)    Fetal Status: Fetal Heart Rate (bpm): 130 Fundal Height: 32 cm Movement: Present     General:  Alert, oriented and cooperative. Patient is in no acute distress.  Skin: Skin is warm and dry. No rash noted.   Cardiovascular: Normal heart rate noted  Respiratory: Normal respiratory effort, no problems with respiration noted  Abdomen: Soft, gravid, appropriate for gestational age. Pain/Pressure: Present     Pelvic: Vag. Bleeding: None     Cervical exam deferred        Extremities: Normal range of motion.  Edema: None  Mental Status: Normal mood and affect. Normal behavior. Normal judgment and thought content.   Urinalysis: Urine Protein: Negative Urine Glucose: Negative  Assessment and Plan:  Pregnancy: G2P1001 at 4089w3d  1. Supervision of normal pregnancy, antepartum, third trimester - Considering waterbirth, discussed class requirement and consent process; plans to register for June class   2. Lead exposure - Reviewed lead results  Preterm labor symptoms and general obstetric precautions including but not limited to vaginal bleeding, contractions, leaking of fluid and fetal movement were reviewed in detail with the patient. Please refer to  After Visit Summary for other counseling recommendations.  Return in about 2 weeks (around 05/14/2016).   Eino FarberWalidah Kennith GainN Andersen, CNM

## 2016-04-30 NOTE — Progress Notes (Signed)
Breastfeeding tip of the week reviewed. 

## 2016-05-27 ENCOUNTER — Other Ambulatory Visit (HOSPITAL_COMMUNITY)
Admission: RE | Admit: 2016-05-27 | Discharge: 2016-05-27 | Disposition: A | Discharge status: Home / Self Care | Payer: Managed Care, Other (non HMO) | Source: Ambulatory Visit | Attending: Advanced Practice Midwife | Admitting: Advanced Practice Midwife

## 2016-05-27 ENCOUNTER — Ambulatory Visit (INDEPENDENT_AMBULATORY_CARE_PROVIDER_SITE_OTHER): Payer: Medicaid Other | Admitting: Advanced Practice Midwife

## 2016-05-27 VITALS — BP 102/68 | HR 80 | Temp 98.5°F | Wt 137.1 lb

## 2016-05-27 DIAGNOSIS — Z113 Encounter for screening for infections with a predominantly sexual mode of transmission: Secondary | ICD-10-CM | POA: Insufficient documentation

## 2016-05-27 DIAGNOSIS — Z3483 Encounter for supervision of other normal pregnancy, third trimester: Secondary | ICD-10-CM

## 2016-05-27 LAB — POCT URINALYSIS DIP (DEVICE)
Bilirubin Urine: NEGATIVE
GLUCOSE, UA: NEGATIVE mg/dL
Hgb urine dipstick: NEGATIVE
Ketones, ur: NEGATIVE mg/dL
LEUKOCYTES UA: NEGATIVE
Nitrite: NEGATIVE
Protein, ur: NEGATIVE mg/dL
UROBILINOGEN UA: 0.2 mg/dL (ref 0.0–1.0)
pH: 6 (ref 5.0–8.0)

## 2016-05-27 LAB — OB RESULTS CONSOLE GBS: GBS: POSITIVE

## 2016-05-27 NOTE — Progress Notes (Signed)
C/o contractions everyday- intermittently

## 2016-05-27 NOTE — Progress Notes (Signed)
Subjective:  Jacqueline Andersen is a 30 y.o. G2P1001 at 4168w2d being seen today for ongoing prenatal care.  She is currently monitored for the following issues for this low-risk pregnancy and has Supervision of normal pregnancy, antepartum and Lead exposure on her problem list.  Patient reports no complaints.  Contractions: Irregular. Vag. Bleeding: None.  Movement: Present. Denies leaking of fluid.   The following portions of the patient's history were reviewed and updated as appropriate: allergies, current medications, past family history, past medical history, past social history, past surgical history and problem list. Problem list updated.  Objective:   Filed Vitals:   05/27/16 1104  BP: 102/68  Pulse: 80  Temp: 98.5 F (36.9 C)  Weight: 137 lb 1.6 oz (62.188 kg)    Fetal Status: Fetal Heart Rate (bpm): 153 Fundal Height: 36 cm Movement: Present     General:  Alert, oriented and cooperative. Patient is in no acute distress.  Skin: Skin is warm and dry. No rash noted.   Cardiovascular: Normal heart rate noted  Respiratory: Normal respiratory effort, no problems with respiration noted  Abdomen: Soft, gravid, appropriate for gestational age. Pain/Pressure: Present     Pelvic: Cervical exam deferred        Extremities: Normal range of motion.  Edema: Trace  Mental Status: Normal mood and affect. Normal behavior. Normal judgment and thought content.   Urinalysis: Urine Protein: Negative Urine Glucose: Negative  Assessment and Plan:  Pregnancy: G2P1001 at 10068w2d  1. Supervision of normal pregnancy, antepartum, third trimester  - Culture, beta strep (group b only) - GC/Chlamydia probe amp ([place] --[place] signed and witnessed, class scheduled tomorro --Plans to use doula service for tub  Preterm labor symptoms and general obstetric precautions including but not limited to vaginal bleeding, contractions, leaking of fluid and fetal movement were reviewed in  detail with the patient. Please refer to After Visit Summary for other counseling recommendations.  Return in about 3 weeks (around 06/17/2016).   Hurshel PartyLisa A Leftwich-Kirby, CNM

## 2016-05-28 ENCOUNTER — Encounter: Payer: Self-pay | Admitting: General Practice

## 2016-05-28 LAB — GC/CHLAMYDIA PROBE AMP (~~LOC~~) NOT AT ARMC
Chlamydia: NEGATIVE
Neisseria Gonorrhea: NEGATIVE

## 2016-05-28 LAB — CULTURE, BETA STREP (GROUP B ONLY)

## 2016-05-29 ENCOUNTER — Encounter: Payer: Self-pay | Admitting: Advanced Practice Midwife

## 2016-05-29 DIAGNOSIS — O99820 Streptococcus B carrier state complicating pregnancy: Secondary | ICD-10-CM | POA: Insufficient documentation

## 2016-06-19 ENCOUNTER — Ambulatory Visit (INDEPENDENT_AMBULATORY_CARE_PROVIDER_SITE_OTHER): Payer: Medicaid Other | Admitting: Obstetrics and Gynecology

## 2016-06-19 VITALS — BP 109/66 | HR 104 | Temp 98.1°F | Wt 138.4 lb

## 2016-06-19 DIAGNOSIS — O99820 Streptococcus B carrier state complicating pregnancy: Secondary | ICD-10-CM

## 2016-06-19 DIAGNOSIS — Z22330 Carrier of Group B streptococcus: Secondary | ICD-10-CM

## 2016-06-19 DIAGNOSIS — Z3483 Encounter for supervision of other normal pregnancy, third trimester: Secondary | ICD-10-CM

## 2016-06-19 LAB — POCT URINALYSIS DIP (DEVICE)
Bilirubin Urine: NEGATIVE
GLUCOSE, UA: NEGATIVE mg/dL
Hgb urine dipstick: NEGATIVE
KETONES UR: NEGATIVE mg/dL
NITRITE: NEGATIVE
PH: 6.5 (ref 5.0–8.0)
PROTEIN: NEGATIVE mg/dL
Specific Gravity, Urine: 1.01 (ref 1.005–1.030)
UROBILINOGEN UA: 0.2 mg/dL (ref 0.0–1.0)

## 2016-06-19 NOTE — Progress Notes (Signed)
Prenatal Visit Note Date: 06/19/2016 Clinic: [facility]  Subjective:  Jacqueline Andersen is a 30 y.o. G2P1001 at 3048w4d being seen today for ongoing prenatal care.  She is currently monitored for the following issues for this low-risk pregnancy and has Supervision of normal pregnancy, antepartum; Lead exposure; and GBS (group B Streptococcus carrier), +RV culture, currently pregnant on her problem list.  Patient reports no complaints.   Contractions: Irregular. Vag. Bleeding: None.  Movement: Present. Denies leaking of fluid.   The following portions of the patient's history were reviewed and updated as appropriate: allergies, current medications, past family history, past medical history, past social history, past surgical history and problem list. Problem list updated.  Objective:   Filed Vitals:   06/19/16 1250  BP: 109/66  Pulse: 104  Temp: 98.1 F (36.7 C)  Weight: 138 lb 6.4 oz (62.778 kg)    Fetal Status: Fetal Heart Rate (bpm): 130   Movement: Present     General:  Alert, oriented and cooperative. Patient is in no acute distress.  Skin: Skin is warm and dry. No rash noted.   Cardiovascular: Normal heart rate noted  Respiratory: Normal respiratory effort, no problems with respiration noted  Abdomen: Soft, gravid, appropriate for gestational age. Pain/Pressure: Present     Pelvic:  1/50/-3      pt desired cx check  Extremities: Normal range of motion.  Edema: Trace  Mental Status: Normal mood and affect. Normal behavior. Normal judgment and thought content.   Urinalysis:      Assessment and Plan:  Pregnancy: G2P1001 at 5648w4d  Routine care Told about GBS pos status D/[facility]  Term labor symptoms and general obstetric precautions including but not limited to vaginal bleeding, contractions, leaking of fluid and fetal movement were reviewed in detail with the patient. Please refer to After Visit Summary for other counseling recommendations.   Return in about 1 week (around 06/26/2016).   [place] Bingharlie Shawndell Schillaci, MD

## 2016-06-19 NOTE — Progress Notes (Signed)
Pt states she is having about 15-20ctx daily lasting about 46-47seconds long. Pt states her mucous is a lot more "slimey"

## 2016-06-26 ENCOUNTER — Telehealth: Payer: Self-pay | Admitting: *Deleted

## 2016-06-26 NOTE — Telephone Encounter (Addendum)
Pt left message stating that she would like to talk to a nurse. She thinks she has gotten a "stomach bug" and has been having diarrhea for a Jacqueline Andersen or so. She is concerned about hydration since she is close to her due date. Please call back.   7/21  Pt seen @ office and concerns were addressed.

## 2016-06-27 ENCOUNTER — Ambulatory Visit (INDEPENDENT_AMBULATORY_CARE_PROVIDER_SITE_OTHER): Payer: Medicaid Other | Admitting: Advanced Practice Midwife

## 2016-06-27 VITALS — BP 120/76 | HR 92 | Temp 98.2°F | Wt 134.9 lb

## 2016-06-27 DIAGNOSIS — Z3483 Encounter for supervision of other normal pregnancy, third trimester: Secondary | ICD-10-CM | POA: Diagnosis present

## 2016-06-27 DIAGNOSIS — Z22330 Carrier of Group B streptococcus: Secondary | ICD-10-CM | POA: Diagnosis not present

## 2016-06-27 DIAGNOSIS — R197 Diarrhea, unspecified: Secondary | ICD-10-CM

## 2016-06-27 DIAGNOSIS — O99820 Streptococcus B carrier state complicating pregnancy: Secondary | ICD-10-CM

## 2016-06-27 LAB — POCT URINALYSIS DIP (DEVICE)
BILIRUBIN URINE: NEGATIVE
Glucose, UA: NEGATIVE mg/dL
HGB URINE DIPSTICK: NEGATIVE
Ketones, ur: NEGATIVE mg/dL
LEUKOCYTES UA: NEGATIVE
Nitrite: NEGATIVE
Protein, ur: NEGATIVE mg/dL
SPECIFIC GRAVITY, URINE: 1.01 (ref 1.005–1.030)
Urobilinogen, UA: 0.2 mg/dL (ref 0.0–1.0)
pH: 6 (ref 5.0–8.0)

## 2016-06-27 NOTE — Progress Notes (Signed)
Subjective:  Jacqueline Andersen is a 30 y.o. G2P1001 at 41102w5d being seen today for ongoing prenatal care.  She is currently monitored for the following issues for this low-risk pregnancy and has Supervision of normal pregnancy, antepartum; Lead exposure; and GBS (group B Streptococcus carrier), +RV culture, currently pregnant on her problem list.  Patient reports occasional contractions and frequent, watery diarrhea x 2 days, improving, chills. No recent ABX use or hospitalization. Family has been mildly sick w/ same Sx.  Contractions: Irregular. Vag. Bleeding: None.  Movement: Present. Denies leaking of fluid.   The following portions of the patient's history were reviewed and updated as appropriate: allergies, current medications, past family history, past medical history, past social history, past surgical history and problem list. Problem list updated.  Objective:   Filed Vitals:   06/27/16 0837  BP: 120/76  Pulse: 92  Temp: 98.2 F (36.8 C)  Weight: 134 lb 14.4 oz (61.19 kg)    Fetal Status: Fetal Heart Rate (bpm): 138 Fundal Height: 39 cm Movement: Present  Presentation: Vertex  General:  Alert, oriented and cooperative. Patient is in no acute distress.  Skin: Skin is warm and dry. No rash noted.   Cardiovascular: Normal heart rate noted  Respiratory: Normal respiratory effort, no problems with respiration noted  Abdomen: Soft, gravid, appropriate for gestational age. Pain/Pressure: Present     Pelvic:  Cervical exam deferred        Extremities: Normal range of motion.  Edema: Trace  Mental Status: Normal mood and affect. Normal behavior. Normal judgment and thought content.   Urinalysis: Urine Protein: Negative Urine Glucose: Negative  Assessment and Plan:  Pregnancy: G2P1001 at 55102w5d 1. Supervision of normal pregnancy, antepartum, third trimester   2. GBS (group B Streptococcus carrier), +RV culture, currently pregnant PCN labor  3. Acute diarrhea--Sounds viral, not like c.  Dif.  - BRAT diet.  - Imodium PRN.   Term labor symptoms and general obstetric precautions including but not limited to vaginal bleeding, contractions, leaking of fluid and fetal movement were reviewed in detail with the patient. Please refer to After Visit Summary for other counseling recommendations.  Discussed recommendation for post-dates IOL if not delivered by 41 weeks. Recommendation based on increase stillbirth risk. Pt verbalizes understanding, but strongly prefers to wait until ~41.2 if NST and AFI are normal at 41 weeks. Agrees to IOL at any time if fetal testing is non-reassuring.   Return in 4 days (on 07/01/2016).   Dorathy KinsmanVirginia [name], CNM

## 2016-06-27 NOTE — Progress Notes (Signed)
States has been having really bad diarrhea x last 2 days, also cold chills and nausea.

## 2016-06-27 NOTE — Patient Instructions (Addendum)
Diarrhea Diarrhea is frequent loose and watery bowel movements. It can cause you to feel weak and dehydrated. Dehydration can cause you to become tired and thirsty, have a dry mouth, and have decreased urination that often is dark yellow. Diarrhea is a sign of another problem, most often an infection that will not last long. In most cases, diarrhea typically lasts 2-3 days. However, it can last longer if it is a sign of something more serious. It is important to treat your diarrhea as directed by your caregiver to lessen or prevent future episodes of diarrhea. CAUSES  Some common causes include:  Gastrointestinal infections caused by viruses, bacteria, or parasites.  Food poisoning or food allergies.  Certain medicines, such as antibiotics, chemotherapy, and laxatives.  Artificial sweeteners and fructose.  Digestive disorders. HOME CARE INSTRUCTIONS  Ensure adequate fluid intake (hydration): Have 1 cup (8 oz) of fluid for each diarrhea episode. Avoid fluids that contain simple sugars or sports drinks, fruit juices, whole milk products, and sodas. Your urine should be clear or pale yellow if you are drinking enough fluids. Hydrate with an oral rehydration solution that you can purchase at pharmacies, retail stores, and online. You can prepare an oral rehydration solution at home by mixing the following ingredients together:   - tsp table salt.   tsp baking soda.   tsp salt substitute containing potassium chloride.  1  tablespoons sugar.  1 L (34 oz) of water.  Certain foods and beverages may increase the speed at which food moves through the gastrointestinal (GI) tract. These foods and beverages should be avoided and include:  Caffeinated and alcoholic beverages.  High-fiber foods, such as raw fruits and vegetables, nuts, seeds, and whole grain breads and cereals.  Foods and beverages sweetened with sugar alcohols, such as xylitol, sorbitol, and mannitol.  Some foods may be well  tolerated and may help thicken stool including:  Starchy foods, such as rice, toast, pasta, low-sugar cereal, oatmeal, grits, baked potatoes, crackers, and bagels.  Bananas.  Applesauce.  Add probiotic-rich foods to help increase healthy bacteria in the GI tract, such as yogurt and fermented milk products.  Wash your hands well after each diarrhea episode.  Only take over-the-counter or prescription medicines as directed by your caregiver.  Take a warm bath to relieve any burning or pain from frequent diarrhea episodes. SEEK IMMEDIATE MEDICAL CARE IF:   You are unable to keep fluids down.  You have persistent vomiting.  You have blood in your stool, or your stools are black and tarry.  You do not urinate in 6-8 hours, or there is only a small amount of very dark urine.  You have abdominal pain that increases or localizes.  You have weakness, dizziness, confusion, or light-headedness.  You have a severe headache.  Your diarrhea gets worse or does not get better.  You have a fever or persistent symptoms for more than 2-3 days.  You have a fever and your symptoms suddenly get worse. MAKE SURE YOU:   Understand these instructions.  Will watch your condition.  Will get help right away if you are not doing well or get worse.   This information is not intended to replace advice given to you by your health care provider. Make sure you discuss any questions you have with your health care provider.   Document Released: 11/14/2002 Document Revised: 12/15/2014 Document Reviewed: 08/01/2012 Elsevier Interactive Patient Education 2016 Elsevier Inc[employer]   Food Choices to Help Relieve Diarrhea, Adult When you have   diarrhea, the foods you eat and your eating habits are very important. Choosing the right foods and drinks can help relieve diarrhea. Also, because diarrhea can last up to 7 days, you need to replace lost fluids and electrolytes (such as sodium, potassium, and chloride) in  order to help prevent dehydration.  WHAT GENERAL GUIDELINES DO I NEED TO FOLLOW?  Slowly drink 1 cup (8 oz) of fluid for each episode of diarrhea. If you are getting enough fluid, your urine will be clear or pale yellow.  Eat starchy foods. Some good choices include white rice, white toast, pasta, low-fiber cereal, baked potatoes (without the skin), saltine crackers, and bagels.  Avoid large servings of any cooked vegetables.  Limit fruit to two servings per day. A serving is  cup or 1 small piece.  Choose foods with less than 2 g of fiber per serving.  Limit fats to less than 8 tsp (38 g) per day.  Avoid fried foods.  Eat foods that have probiotics in them. Probiotics can be found in certain dairy products.  Avoid foods and beverages that may increase the speed at which food moves through the stomach and intestines (gastrointestinal tract). Things to avoid include:  High-fiber foods, such as dried fruit, raw fruits and vegetables, nuts, seeds, and whole grain foods.  Spicy foods and high-fat foods.  Foods and beverages sweetened with high-fructose corn syrup, honey, or sugar alcohols such as xylitol, sorbitol, and mannitol. WHAT FOODS ARE RECOMMENDED? Grains White rice. White, French, or pita breads (fresh or toasted), including plain rolls, buns, or bagels. White pasta. Saltine, soda, or graham crackers. Pretzels. Low-fiber cereal. Cooked cereals made with water (such as cornmeal, farina, or cream cereals). Plain muffins. Matzo. Melba toast. Zwieback.  Vegetables Potatoes (without the skin). Strained tomato and vegetable juices. Most well-cooked and canned vegetables without seeds. Tender lettuce. Fruits Cooked or canned applesauce, apricots, cherries, fruit cocktail, grapefruit, peaches, pears, or plums. Fresh bananas, apples without skin, cherries, grapes, cantaloupe, grapefruit, peaches, oranges, or plums.  Meat and Other Protein Products Baked or boiled chicken. Eggs. Tofu.  Fish. Seafood. Smooth peanut butter. Ground or well-cooked tender beef, ham, veal, lamb, pork, or poultry.  Dairy Plain yogurt, kefir, and unsweetened liquid yogurt. Lactose-free milk, buttermilk, or soy milk. Plain hard cheese. Beverages Sport drinks. Clear broths. Diluted fruit juices (except prune). Regular, caffeine-free sodas such as ginger ale. Water. Decaffeinated teas. Oral rehydration solutions. Sugar-free beverages not sweetened with sugar alcohols. Other Bouillon, broth, or soups made from recommended foods.  The items listed above may not be a complete list of recommended foods or beverages. Contact your dietitian for more options. WHAT FOODS ARE NOT RECOMMENDED? Grains Whole grain, whole wheat, bran, or rye breads, rolls, pastas, crackers, and cereals. Wild or brown rice. Cereals that contain more than 2 g of fiber per serving. Corn tortillas or taco shells. Cooked or dry oatmeal. Granola. Popcorn. Vegetables Raw vegetables. Cabbage, broccoli, Brussels sprouts, artichokes, baked beans, beet greens, corn, kale, legumes, peas, sweet potatoes, and yams. Potato skins. Cooked spinach and cabbage. Fruits Dried fruit, including raisins and dates. Raw fruits. Stewed or dried prunes. Fresh apples with skin, apricots, mangoes, pears, raspberries, and strawberries.  Meat and Other Protein Products Chunky peanut butter. Nuts and seeds. Beans and lentils. Bacon.  Dairy High-fat cheeses. Milk, chocolate milk, and beverages made with milk, such as milk shakes. Cream. Ice cream. Sweets and Desserts Sweet rolls, doughnuts, and sweet breads. Pancakes and waffles. Fats and Oils Butter. Cream sauces.   Margarine. Salad oils. Plain salad dressings. Olives. Avocados.  Beverages Caffeinated beverages (such as coffee, tea, soda, or energy drinks). Alcoholic beverages. Fruit juices with pulp. Prune juice. Soft drinks sweetened with high-fructose corn syrup or sugar alcohols. Other Coconut. Hot sauce.  Chili powder. Mayonnaise. Gravy. Cream-based or milk-based soups.  The items listed above may not be a complete list of foods and beverages to avoid. Contact your dietitian for more information. WHAT SHOULD I DO IF I BECOME DEHYDRATED? Diarrhea can sometimes lead to dehydration. Signs of dehydration include dark urine and dry mouth and skin. If you think you are dehydrated, you should rehydrate with an oral rehydration solution. These solutions can be purchased at pharmacies, retail stores, or online.  Drink -1 cup (120-240 mL) of oral rehydration solution each time you have an episode of diarrhea. If drinking this amount makes your diarrhea worse, try drinking smaller amounts more often. For example, drink 1-3 tsp (5-15 mL) every 5-10 minutes.  A general rule for staying hydrated is to drink 1-2 L of fluid per day. Talk to your health care provider about the specific amount you should be drinking each day. Drink enough fluids to keep your urine clear or pale yellow.   This information is not intended to replace advice given to you by your health care provider. Make sure you discuss any questions you have with your health care provider.   Document Released: 02/14/2004 Document Revised: 12/15/2014 Document Reviewed: 10/17/2013 Elsevier Interactive Patient Education 2016 Elsevier Inc.   

## 2016-06-28 ENCOUNTER — Inpatient Hospital Stay (HOSPITAL_COMMUNITY)
Admission: AD | Admit: 2016-06-28 | Discharge: 2016-07-01 | DRG: 775 | Disposition: A | Discharge status: Home / Self Care | Payer: Medicaid Other | Source: Ambulatory Visit | Attending: Family Medicine | Admitting: Family Medicine

## 2016-06-28 DIAGNOSIS — Z3A40 40 weeks gestation of pregnancy: Secondary | ICD-10-CM

## 2016-06-28 DIAGNOSIS — Z3490 Encounter for supervision of normal pregnancy, unspecified, unspecified trimester: Secondary | ICD-10-CM

## 2016-06-28 DIAGNOSIS — O99824 Streptococcus B carrier state complicating childbirth: Principal | ICD-10-CM | POA: Diagnosis present

## 2016-06-28 DIAGNOSIS — Z8711 Personal history of peptic ulcer disease: Secondary | ICD-10-CM

## 2016-06-28 DIAGNOSIS — Z8659 Personal history of other mental and behavioral disorders: Secondary | ICD-10-CM

## 2016-06-29 ENCOUNTER — Encounter (HOSPITAL_COMMUNITY): Payer: Self-pay | Admitting: Emergency Medicine

## 2016-06-29 DIAGNOSIS — Z3490 Encounter for supervision of normal pregnancy, unspecified, unspecified trimester: Secondary | ICD-10-CM

## 2016-06-29 DIAGNOSIS — Z3483 Encounter for supervision of other normal pregnancy, third trimester: Secondary | ICD-10-CM | POA: Diagnosis present

## 2016-06-29 DIAGNOSIS — Z3A40 40 weeks gestation of pregnancy: Secondary | ICD-10-CM | POA: Diagnosis not present

## 2016-06-29 DIAGNOSIS — Z8659 Personal history of other mental and behavioral disorders: Secondary | ICD-10-CM | POA: Diagnosis not present

## 2016-06-29 DIAGNOSIS — O99824 Streptococcus B carrier state complicating childbirth: Secondary | ICD-10-CM | POA: Diagnosis present

## 2016-06-29 DIAGNOSIS — Z8711 Personal history of peptic ulcer disease: Secondary | ICD-10-CM | POA: Diagnosis not present

## 2016-06-29 LAB — ABO/RH: ABO/RH(D): A POS

## 2016-06-29 LAB — OB RESULTS CONSOLE ABO/RH: RH TYPE: POSITIVE

## 2016-06-29 LAB — CBC
HEMATOCRIT: 34.1 % — AB (ref 36.0–46.0)
Hemoglobin: 11.5 g/dL — ABNORMAL LOW (ref 12.0–15.0)
MCH: 27.1 pg (ref 26.0–34.0)
MCHC: 33.7 g/dL (ref 30.0–36.0)
MCV: 80.4 fL (ref 78.0–100.0)
PLATELETS: 189 10*3/uL (ref 150–400)
RBC: 4.24 MIL/uL (ref 3.87–5.11)
RDW: 14.8 % (ref 11.5–15.5)
WBC: 8.7 10*3/uL (ref 4.0–10.5)

## 2016-06-29 LAB — TYPE AND SCREEN
ABO/RH(D): A POS
ANTIBODY SCREEN: NEGATIVE

## 2016-06-29 LAB — HEPATITIS B SURFACE ANTIGEN: Hepatitis B Surface Ag: NEGATIVE

## 2016-06-29 LAB — RPR: RPR Ser Ql: NONREACTIVE

## 2016-06-29 SURGERY — Surgical Case
Anesthesia: *Unknown

## 2016-06-29 MED ORDER — ACETAMINOPHEN 325 MG PO TABS: 650.0000 mg | ORAL_TABLET | ORAL | Status: DC | PRN

## 2016-06-29 MED ORDER — ONDANSETRON HCL 4 MG/2ML IJ SOLN
4.0000 mg | INTRAMUSCULAR | Status: DC | PRN
Start: 2016-06-29 — End: 2016-07-01

## 2016-06-29 MED ORDER — PENICILLIN G POTASSIUM 5000000 UNITS IJ SOLR
5.0000 10*6.[IU] | Freq: Once | INTRAMUSCULAR | Status: AC
  Administered 2016-06-29: 5 10*6.[IU] via INTRAVENOUS

## 2016-06-29 MED ORDER — LACTATED RINGERS IV SOLN
INTRAVENOUS | Status: DC
  Administered 2016-06-29: 125 mL/h via INTRAVENOUS

## 2016-06-29 MED ORDER — OXYCODONE-ACETAMINOPHEN 5-325 MG PO TABS: 1.0000 | ORAL_TABLET | ORAL | Status: DC | PRN

## 2016-06-29 MED ORDER — SIMETHICONE 80 MG PO CHEW: 80.0000 mg | CHEWABLE_TABLET | ORAL | Status: DC | PRN

## 2016-06-29 MED ORDER — TETANUS-DIPHTH-ACELL PERTUSSIS 5-2.5-18.5 LF-MCG/0.5 IM SUSP: 0.5000 mL | Freq: Once | INTRAMUSCULAR | Status: DC

## 2016-06-29 MED ORDER — LACTATED RINGERS IV SOLN: 500.0000 mL | INTRAVENOUS | Status: DC | PRN

## 2016-06-29 MED ORDER — ZOLPIDEM TARTRATE 5 MG PO TABS: 5.0000 mg | ORAL_TABLET | Freq: Every evening | ORAL | Status: DC | PRN

## 2016-06-29 MED ORDER — MEASLES, MUMPS & RUBELLA VAC ~~LOC~~ INJ
0.5000 mL | INJECTION | Freq: Once | SUBCUTANEOUS | Status: DC
  Filled 2016-06-29: qty 0.5

## 2016-06-29 MED ORDER — SOD CITRATE-CITRIC ACID 500-334 MG/5ML PO SOLN: 30.0000 mL | ORAL | Status: DC | PRN

## 2016-06-29 MED ORDER — FLEET ENEMA 7-19 GM/118ML RE ENEM: 1.0000 | ENEMA | Freq: Every day | RECTAL | Status: DC | PRN

## 2016-06-29 MED ORDER — OXYTOCIN BOLUS FROM INFUSION
500.0000 mL | Freq: Once | INTRAVENOUS | Status: AC
  Administered 2016-06-29: 500 mL via INTRAVENOUS

## 2016-06-29 MED ORDER — LIDOCAINE HCL (PF) 1 % IJ SOLN
30.0000 mL | INTRAMUSCULAR | Status: DC | PRN
  Filled 2016-06-29: qty 30

## 2016-06-29 MED ORDER — ONDANSETRON HCL 4 MG/2ML IJ SOLN: 4.0000 mg | Freq: Four times a day (QID) | INTRAMUSCULAR | Status: DC | PRN

## 2016-06-29 MED ORDER — COCONUT OIL OIL
1.0000 "application " | TOPICAL_OIL | Status: DC | PRN
  Administered 2016-07-01: 1 via TOPICAL
  Filled 2016-06-29: qty 120

## 2016-06-29 MED ORDER — DIPHENHYDRAMINE HCL 25 MG PO CAPS: 25.0000 mg | ORAL_CAPSULE | Freq: Four times a day (QID) | ORAL | Status: DC | PRN

## 2016-06-29 MED ORDER — OXYTOCIN 40 UNITS IN LACTATED RINGERS INFUSION - SIMPLE MED: 2.5000 [IU]/h | INTRAVENOUS | Status: DC

## 2016-06-29 MED ORDER — SENNOSIDES-DOCUSATE SODIUM 8.6-50 MG PO TABS
2.0000 | ORAL_TABLET | ORAL | Status: DC
  Administered 2016-06-29: 2 via ORAL
  Filled 2016-06-29 (×2): qty 2

## 2016-06-29 MED ORDER — SODIUM CHLORIDE 0.9% FLUSH: 3.0000 mL | Freq: Two times a day (BID) | INTRAVENOUS | Status: DC

## 2016-06-29 MED ORDER — BISACODYL 10 MG RE SUPP: 10.0000 mg | Freq: Every day | RECTAL | Status: DC | PRN

## 2016-06-29 MED ORDER — ONDANSETRON HCL 4 MG PO TABS: 4.0000 mg | ORAL_TABLET | ORAL | Status: DC | PRN

## 2016-06-29 MED ORDER — WITCH HAZEL-GLYCERIN EX PADS: 1.0000 "application " | MEDICATED_PAD | CUTANEOUS | Status: DC | PRN

## 2016-06-29 MED ORDER — BENZOCAINE-MENTHOL 20-0.5 % EX AERO: 1.0000 "application " | INHALATION_SPRAY | CUTANEOUS | Status: DC | PRN

## 2016-06-29 MED ORDER — SODIUM CHLORIDE 0.9% FLUSH: 3.0000 mL | INTRAVENOUS | Status: DC | PRN

## 2016-06-29 MED ORDER — PRENATAL MULTIVITAMIN CH
1.0000 | ORAL_TABLET | Freq: Every day | ORAL | Status: DC
  Filled 2016-06-29 (×2): qty 1

## 2016-06-29 MED ORDER — SODIUM CHLORIDE 0.9 % IV SOLN: 250.0000 mL | INTRAVENOUS | Status: DC | PRN

## 2016-06-29 MED ORDER — IBUPROFEN 600 MG PO TABS
600.0000 mg | ORAL_TABLET | Freq: Four times a day (QID) | ORAL | Status: DC
  Administered 2016-06-29 – 2016-06-30 (×6): 600 mg via ORAL
  Filled 2016-06-29 (×7): qty 1

## 2016-06-29 MED ORDER — OXYCODONE-ACETAMINOPHEN 5-325 MG PO TABS: 2.0000 | ORAL_TABLET | ORAL | Status: DC | PRN

## 2016-06-29 MED ORDER — DIBUCAINE 1 % RE OINT: 1.0000 "application " | TOPICAL_OINTMENT | RECTAL | Status: DC | PRN

## 2016-06-29 NOTE — Lactation Note (Signed)
This note was copied from a baby's chart. Lactation Consultation Note  Patient Name: Jacqueline Andersen [id number] Date: 06/29/2016 Reason for consult: Initial assessment Baby is 12 hours old and has been to the breast successfully several times since birth. Presently  Latched with depth , upper lip noted to be turned under, LC flipped to flange position. Multiply swallows noted.  LC also shifted hips to obtain better depth, per mom more comfortable. Per mom mentioned she was getting sore above the areola where the baby's upper lip positioned. More swallows noted when the baby's jops shifted. Per mom breast fed 1st baby > a year with out problems.  Mother informed of post-discharge support and given phone number to the lactation department, including services for phone call assistance; out-patient appointments; and breastfeeding support group. List of other breastfeeding resources in the community given in the handout. Encouraged mother to call for problems or concerns related to breastfeeding.  Maternal Data    Feeding Feeding Type:  (baby latched with depth ) Length of feed: 14 min (LC obs/ latched after 10 mins/feeding/fed 14 mins. swallows )  LATCH Score/Interventions Latch: Grasps breast easily, tongue down, lips flanged, rhythmical sucking.  Audible Swallowing: Spontaneous and intermittent  Type of Nipple: Everted at rest and after stimulation  Comfort (Breast/Nipple): Soft / non-tender     Hold (Positioning): No assistance needed to correctly position infant at breast. (mom latched/ LC flipped upperlip , and shifted hips / depth )  LATCH Score: 10  Lactation Tools Discussed/Used     Consult Status Consult Status: Follow-up Date: 06/30/16 Follow-up type: In-patient    Kathrin Greathouse 06/29/2016, 4:29 PM

## 2016-06-29 NOTE — Progress Notes (Signed)
Notified of pt arrival in MAU and exam. Will admit to labor and delivery.  

## 2016-06-29 NOTE — Lactation Note (Signed)
This note was copied from a baby's chart. Lactation Consultation Note  Patient Name: Jacqueline Andersen [id number] Date: 06/29/2016 Reason for consult: Initial assessment  2nd visit today . Mom trying to latch the baby. LC assisted the baby with depth, and positioning. Multiply swallows noted, and increased with breast compressions. Baby still feeding at 10 mins actively.  Mom seemed excited that the baby was feeding so well.    Maternal Data    Feeding Feeding Type:  (baby latched with depth ) Length of feed: 14 min (LC obs/ latched after 10 mins/feeding/fed 14 mins. swallows )  LATCH Score/Interventions Latch: Grasps breast easily, tongue down, lips flanged, rhythmical sucking.  Audible Swallowing: Spontaneous and intermittent  Type of Nipple: Everted at rest and after stimulation  Comfort (Breast/Nipple): Soft / non-tender     Hold (Positioning): No assistance needed to correctly position infant at breast. (mom latched/ LC flipped upperlip , and shifted hips / depth )  LATCH Score: 10  Lactation Tools Discussed/Used     Consult Status Consult Status: Follow-up Date: 06/30/16 Follow-up type: In-patient    Jacqueline Andersen 06/29/2016, 5:31 PM

## 2016-06-29 NOTE — H&P (Signed)
EPIC down when pt admitted, late note  Jacqueline Andersen is a 30 y.o. G66P1001 female at [redacted]w[redacted]d by LMP c/w 12wk u/s, presenting in active labor.   Reports active fetal movement, contractions: regular, vaginal bleeding: none, membranes: intact. Prenatal care at [facility].    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 Anorexia nervosa 60-20 years old  . Gastric ulcer 30 years old    Past Surgical History: Past Surgical History:  Procedure Laterality Date  . HERNIA REPAIR     as a baby    Obstetrical History: OB History    Gravida Para Term Preterm AB Living   2 1 1  0 0 1   SAB TAB Ectopic Multiple Live Births   0 0 0 0        Social History: Social History   Social History  . Marital status: Married    Spouse name: N/A  . Number of children: N/A  . Years of education: N/A   Social History Main Topics  . Smoking status: Never Smoker  . Smokeless tobacco: Never Used  . Alcohol use No  . Drug use:     Types: Marijuana     Comment: long time ago  . Sexual activity: Yes    Birth control/ protection: None   Other Topics Concern  . Not on file   Social History Narrative  . No narrative on file    Family History: Family History  Problem Relation Age of Onset  . Cancer Maternal Grandmother   . Cancer Paternal Grandmother     Allergies: No Known Allergies  Prescriptions Prior to Admission  Medication Sig Dispense Refill Last Dose  . famotidine-calcium carbonate-magnesium hydroxide (PEPCID COMPLETE) [phone] MG chewable tablet Chew 2 tablets by mouth 2 (two) times daily as needed. 60 tablet 3 Taking  . MAGNESIUM OXIDE -MG SUPPLEMENT PO Take by mouth. Reported on 06/27/2016   Not Taking  . Prenatal Vit-Fe Fumarate-FA (PRENATAL MULTIVITAMIN) TABS tablet Take 1 tablet by mouth daily at  12 noon.   Taking    Review of Systems  Pertinent pos/neg as indicated in HPI  Blood pressure (!) 142/82, pulse 75, temperature 98.1 F (36.7 C), temperature source Oral, resp. rate 18, height 5\' 4"  (1.626 m), weight 60.8 kg (134 lb), last menstrual period 09/23/2015. General appearance: alert, cooperative and mild distress Lungs: clear to auscultation bilaterally Heart: regular rate and rhythm Abdomen: gravid, soft, non-tender Extremities: No edema DTR's 2+  Fetal monitoring: Cat 1,  variability: moderate,  Accelerations: Present,  decelerations:  Absent Uterine activity: regular  4-5/100/BBOW vtx in MAU  Prenatal labs: ABO, Rh: --/--/A POS, A POS (07/23 0135) Antibody: NEG (07/23 0135) Rubella: !Error! RPR: NON REAC (05/03 1018)  HBsAg:   neg HIV: NONREACTIVE (05/03 1018)  GBS:   Pos  1 hr Glucola: 107 Genetic screening:  Normal AFP Anatomy US: normal female  Results for orders placed or performed in visit on 06/27/16 (from the past 24 hour(s))  CBC   Collection Time: 06/29/16  1:35 AM  Result Value Ref Range   WBC 8.7 4.0 - 10.5 K/uL   RBC 4.24 3.87 - 5.11 MIL/uL   Hemoglobin 11.5 (L) 12.0 - 15.0 g/dL   HCT 60.4 (L) 54.0 - 98.1 %   MCV 80.4 78.0 - 100.0 fL   MCH  27.1 26.0 - 34.0 pg   MCHC 33.7 30.0 - 36.0 g/dL   RDW 16.1 09.6 - 04.5 %   Platelets 189 150 - 400 K/uL  Type and screen   Collection Time: 06/29/16  1:35 AM  Result Value Ref Range   ABO/RH(D) A POS    Antibody Screen NEG    Sample Expiration 07/02/2016   ABO/Rh   Collection Time: 06/29/16  1:35 AM  Result Value Ref Range   ABO/RH(D) A POS      Assessment:  [redacted]w[redacted]d SIUP  G2P1001  Active labor  Desires waterbirth  Cat 1 FHR  GBS  Pos  Plan:  Admit to BS  IV pain meds/epidural prn active labor  Expectant management  PCN for GBS+  Anticipate NSVB   Plans to breastfeed  Contraception: IUD  Circumcision: declines  Marge Duncans CNM, [facility] 06/29/2016, 6:51 AM

## 2016-06-30 NOTE — Progress Notes (Signed)
Post Partum Day 1  Subjective:  Jacqueline Andersen is a 30 y.o. [id number] [redacted]w[redacted]d s/p NSVD via waterbirth.  No acute events overnight.  Pt denies problems with ambulating, voiding or po intake.  She denies nausea or vomiting.  Pain is well controlled.  She has had flatus. She has had bowel movement.  Lochia Minimal.  Plan for birth control is IUD.  Method of Feeding: Breastfeeding.  Objective: BP (!) 91/54 (BP Location: Right Arm)   Pulse 72   Temp 97.9 F (36.6 C) (Oral)   Resp 18   Ht 5\' 4"  (1.626 m)   Wt 60.8 kg (134 lb)   LMP 09/23/2015 (Exact Date)   SpO2 99%   Breastfeeding? Unknown   BMI 23.00 kg/m   Physical Exam:  General: alert, cooperative and no distress Lochia:normal flow Chest: CTAB Heart: RRR no m/r/g Abdomen: +BS, soft, nontender, fundus firm at umbilicus Uterine Fundus: firm, minimally tender to palpation DVT Evaluation: Negative Homan's sign. No swelling of lower extremities.  Extremities: trace edema   Recent Labs  06/29/16 0135  HGB 11.5*  HCT 34.1*    Assessment/Plan:  ASSESSMENT: Jacqueline Andersen is a 30 y.o. G2P2002 [redacted]w[redacted]d ppd #1 s/p NSVD in birthtub and doing well.  Inadequate treatment for GBS (1 dose of 5 million units of PCN prior to birth) so hold for 48 hour discharge.   Plan for discharge tomorrow, Breastfeeding and Contraception IUD   LOS: 1 day   Clyde Canterbury 06/30/2016, 9:04 AM   OB FELLOW MEDICAL STUDENT NOTE ATTESTATION  I have seen and examined this patient. Note this is a Psychologist, occupational note and as such does not necessarily reflect the patient's plan of care. Please see progress by Ernestina Penna MD note for this date of service.    Ernestina Penna 06/30/2016, 11:14 AM

## 2016-06-30 NOTE — Progress Notes (Signed)
POSTPARTUM PROGRESS NOTE  Post Partum Day 1 Subjective:  Jacqueline Andersen is a 30 y.o. U9W1191 [redacted]w[redacted]d s/p SVD in birth tub.  No acute events overnight.  Pt denies problems with ambulating, voiding or po intake.  She denies nausea or vomiting.  Pain is well controlled.  She has had flatus. She has had bowel movement.  Lochia Minimal.   Objective: Blood pressure (!) 91/54, pulse 72, temperature 97.9 F (36.6 C), temperature source Oral, resp. rate 18, height 5\' 4"  (1.626 m), weight 134 lb (60.8 kg), last menstrual period 09/23/2015, SpO2 99 %, unknown if currently breastfeeding.  Physical Exam:  General: alert, cooperative and no distress Lochia:normal flow Chest: CTAB Heart: RRR no m/r/g Abdomen: +BS, soft, nontender,  Uterine Fundus: firm, appropriately tneder DVT Evaluation: No calf swelling or tenderness Extremities: trace edema   Recent Labs  06/29/16 0135  HGB 11.5*  HCT 34.1*    Assessment/Plan:  ASSESSMENT: Jacqueline Andersen is a 30 y.o. Y7W2956 [redacted]w[redacted]d s/p SVD in brithtub.  Plan for discharge tomorrow, Breastfeeding and Contraception IUD, GBS inadequate treatment so 48 hour discharge.    LOS: 1 day   Ernestina Penna 06/30/2016, 7:59 AM

## 2016-06-30 NOTE — Lactation Note (Signed)
This note was copied from a baby's chart. Lactation Consultation Note Follow up consult with this mom of a term baby, now 61 hours old. The baby is cluster feeding, as per mom. He was at the end of a feeding when I walked in the room. The baby was latched in a semi cradle hold, with the baby's legs in her lap. Mom's nipple was slightly pinched after feeding. I reviewed with mom what a deep latch looks like, and how to position the baby so he is in alignment, to keep her nipple centered in his mouth. Mom has lots of easily expressed colostrum. Mom knows to call lactation for questions/concerns, as needed. Patient Name: Boy Desja Boro [id number] Date: 06/30/2016 Reason for consult: Follow-up assessment   Maternal Data    Feeding Feeding Type: Breast Fed  LATCH Score/Interventions Latch: Grasps breast easily, tongue down, lips flanged, rhythmical sucking.  Audible Swallowing: Spontaneous and intermittent  Type of Nipple: Everted at rest and after stimulation  Comfort (Breast/Nipple): Filling, red/small blisters or bruises, mild/mod discomfort  Problem noted: Mild/Moderate discomfort  Hold (Positioning): No assistance needed to correctly position infant at breast. Intervention(s): Breastfeeding basics reviewed;Support Pillows;Position options  LATCH Score: 9  Lactation Tools Discussed/Used     Consult Status Consult Status: Complete Follow-up type: Call as needed    Alfred Levins 06/30/2016, 9:37 AM

## 2016-07-01 ENCOUNTER — Other Ambulatory Visit: Payer: Medicaid Other

## 2016-07-01 MED ORDER — IBUPROFEN 600 MG PO TABS: 600.0000 mg | ORAL_TABLET | Freq: Four times a day (QID) | ORAL | 0 refills | Status: AC | PRN

## 2016-07-01 NOTE — Lactation Note (Signed)
This note was copied from a baby's chart. Lactation Consultation Note Brief follow up consult with this mom of a term baby. Baby feeding when I walked in. Mom still had baby in cradle hold, with baby in her lap. I repositioned the baby across her chest, with her permission, sot show how how this allows baby to latch deeper. Mom receptive to teaching. Breaat care/engorgement reviewed. Mom knows to call for questions/concerns after discharge, to lactation. Patient Name: Jacqueline Andersen [id number] Date: 07/01/2016 Reason for consult: Follow-up assessment   Maternal Data    Feeding Feeding Type: Breast Fed Length of feed: 30 min  LATCH Score/Interventions Latch: Grasps breast easily, tongue down, lips flanged, rhythmical sucking.  Audible Swallowing: Spontaneous and intermittent Intervention(s): Skin to skin  Type of Nipple: Everted at rest and after stimulation  Comfort (Breast/Nipple): Filling, red/small blisters or bruises, mild/mod discomfort  Problem noted: Filling;Mild/Moderate discomfort Interventions (Filling): Hand pump;Reverse pressure Interventions (Mild/moderate discomfort): Hand expression  Hold (Positioning): Assistance needed to correctly position infant at breast and maintain latch.  LATCH Score: 8  Lactation Tools Discussed/Used     Consult Status Consult Status: Complete Follow-up type: Call as needed    Alfred Levins 07/01/2016, 8:42 AM

## 2016-07-01 NOTE — Discharge Summary (Signed)
OB Discharge Summary     Patient Name: Jacqueline Andersen DOB: 1985/12/26 MRN: [id number]  Date of admission: 06/28/2016 Delivering MD: Shawna Clamp R   Date of discharge: 07/01/2016  Admitting diagnosis: labor Intrauterine pregnancy: [redacted]w[redacted]d     Secondary diagnosis:  Active Problems:   Pregnancy  Additional problems: none     Discharge diagnosis: Term Pregnancy Delivered                                                                                                Post partum procedures:none  Augmentation: none  Complications: None  Hospital course:  Onset of Labor With Vaginal Delivery     30 y.o. yo [id number] at [redacted]w[redacted]d was admitted in Active Labor on 06/28/2016. Patient had an uncomplicated labor course as follows:  Membrane Rupture Time/Date: 2:19 AM ,06/29/2016   Intrapartum Procedures: Episiotomy: None [1]                                         Lacerations:  None [1]  Patient had a delivery of a Viable infant in the water. 06/29/2016  Information for the patient's newborn:  Jacqueline, Andersen [[id number]]  Delivery Method: Vaginal, Spontaneous Delivery (Filed from Delivery Summary)    Pateint had an uncomplicated postpartum course.  She is ambulating, tolerating a regular diet, passing flatus, and urinating well. Patient is discharged home in stable condition on 07/01/16.    Physical exam Vitals:   06/30/16 0519 06/30/16 1747 07/01/16 0625 07/01/16 0651  BP: (!) 91/54 114/69 103/72   Pulse: 72 85 63   Resp: 18 18 18    Temp: 97.9 F (36.6 C) 98.2 F (36.8 C)  98.3 F (36.8 C)  TempSrc: Oral Oral  Oral  SpO2:      Weight:      Height:       General: alert and cooperative Lochia: appropriate Uterine Fundus: firm Incision: N/A DVT Evaluation: No evidence of DVT seen on physical exam. Labs: Lab Results  Component Value Date   WBC 8.7 06/29/2016   HGB 11.5 (L) 06/29/2016   HCT 34.1 (L) 06/29/2016   MCV 80.4 06/29/2016   PLT 189 06/29/2016   No flowsheet data  found.  Discharge instruction: per After Visit Summary and "Baby and Me Booklet".  After visit meds:    Medication List    TAKE these medications   ibuprofen 600 MG tablet Commonly known as:  ADVIL,MOTRIN Take 1 tablet (600 mg total) by mouth every 6 (six) hours as needed.   prenatal multivitamin Tabs tablet Take 1 tablet by mouth daily at 12 noon.       Diet: routine diet  Activity: Advance as tolerated. Pelvic rest for 6 weeks.   Outpatient follow up:6 weeks Follow up Appt:No future appointments. Follow up Visit:No Follow-up on file.  Postpartum contraception: IUD Mirena  Newborn Data: Live born female  Birth Weight: 7 lb 13 oz (3545 g) APGAR: 6, 9  Baby Feeding: Breast Disposition:home with mother  07/01/2016 Cam Hai, CNM  7:40 AM

## 2016-07-01 NOTE — Discharge Instructions (Signed)

## 2016-07-04 ENCOUNTER — Other Ambulatory Visit: Payer: Medicaid Other | Admitting: Obstetrics & Gynecology

## 2016-07-10 ENCOUNTER — Encounter: Payer: Self-pay | Admitting: *Deleted

## 2016-07-24 ENCOUNTER — Encounter: Payer: Self-pay | Admitting: *Deleted

## 2016-09-03 ENCOUNTER — Ambulatory Visit: Payer: Managed Care, Other (non HMO) | Admitting: Advanced Practice Midwife

## 2018-03-09 ENCOUNTER — Encounter: Payer: Self-pay | Admitting: *Deleted
